# Patient Record
Sex: Female | Born: 1935 | Race: White | Hispanic: No | Smoking: Never smoker
Health system: Southern US, Community
[De-identification: ages and names within clinical notes are randomized; demographics above are authoritative.]

## PROBLEM LIST (undated history)

## (undated) DIAGNOSIS — E7800 Pure hypercholesterolemia, unspecified: Secondary | ICD-10-CM

## (undated) DIAGNOSIS — I10 Essential (primary) hypertension: Secondary | ICD-10-CM

## (undated) DIAGNOSIS — I2510 Atherosclerotic heart disease of native coronary artery without angina pectoris: Secondary | ICD-10-CM

## (undated) HISTORY — PX: HUMERUS SURGERY: SHX672

## (undated) HISTORY — PX: REPLACEMENT TOTAL KNEE: SUR1224

## (undated) HISTORY — PX: CARDIAC SURGERY: SHX584

## (undated) HISTORY — PX: BACK SURGERY: SHX140

---

## 2013-10-14 ENCOUNTER — Encounter (HOSPITAL_COMMUNITY): Payer: Self-pay | Admitting: Emergency Medicine

## 2013-10-14 ENCOUNTER — Emergency Department (HOSPITAL_COMMUNITY): Payer: Medicare Other

## 2013-10-14 ENCOUNTER — Emergency Department (HOSPITAL_COMMUNITY)
Admission: EM | Admit: 2013-10-14 | Discharge: 2013-10-14 | Disposition: A | Discharge status: Home / Self Care | Payer: Medicare Other | Attending: Emergency Medicine | Admitting: Emergency Medicine

## 2013-10-14 DIAGNOSIS — I10 Essential (primary) hypertension: Secondary | ICD-10-CM | POA: Insufficient documentation

## 2013-10-14 DIAGNOSIS — Z951 Presence of aortocoronary bypass graft: Secondary | ICD-10-CM | POA: Insufficient documentation

## 2013-10-14 DIAGNOSIS — Z79899 Other long term (current) drug therapy: Secondary | ICD-10-CM | POA: Insufficient documentation

## 2013-10-14 DIAGNOSIS — L97209 Non-pressure chronic ulcer of unspecified calf with unspecified severity: Secondary | ICD-10-CM

## 2013-10-14 DIAGNOSIS — Z87828 Personal history of other (healed) physical injury and trauma: Secondary | ICD-10-CM | POA: Insufficient documentation

## 2013-10-14 DIAGNOSIS — I8000 Phlebitis and thrombophlebitis of superficial vessels of unspecified lower extremity: Secondary | ICD-10-CM | POA: Insufficient documentation

## 2013-10-14 DIAGNOSIS — Z7901 Long term (current) use of anticoagulants: Secondary | ICD-10-CM | POA: Insufficient documentation

## 2013-10-14 DIAGNOSIS — I809 Phlebitis and thrombophlebitis of unspecified site: Secondary | ICD-10-CM

## 2013-10-14 DIAGNOSIS — I2510 Atherosclerotic heart disease of native coronary artery without angina pectoris: Secondary | ICD-10-CM | POA: Insufficient documentation

## 2013-10-14 DIAGNOSIS — E7800 Pure hypercholesterolemia, unspecified: Secondary | ICD-10-CM | POA: Insufficient documentation

## 2013-10-14 HISTORY — DX: Essential (primary) hypertension: I10

## 2013-10-14 HISTORY — DX: Pure hypercholesterolemia, unspecified: E78.00

## 2013-10-14 HISTORY — DX: Atherosclerotic heart disease of native coronary artery without angina pectoris: I25.10

## 2013-10-14 LAB — BASIC METABOLIC PANEL
BUN: 14 mg/dL (ref 6–23)
CHLORIDE: 95 meq/L — AB (ref 96–112)
CO2: 28 mEq/L (ref 19–32)
CREATININE: 0.8 mg/dL (ref 0.50–1.10)
Calcium: 9.6 mg/dL (ref 8.4–10.5)
GFR calc non Af Amer: 68 mL/min — ABNORMAL LOW (ref >90)
GFR, EST AFRICAN AMERICAN: 79 mL/min — AB (ref >90)
GLUCOSE: 105 mg/dL — AB (ref 70–99)
Potassium: 4.5 mEq/L (ref 3.7–5.3)
Sodium: 133 mEq/L — ABNORMAL LOW (ref 137–147)

## 2013-10-14 LAB — CBC WITH DIFFERENTIAL/PLATELET
Basophils Absolute: 0 K/uL (ref 0.0–0.1)
Basophils Relative: 0 % (ref 0–1)
Eosinophils Absolute: 0 K/uL (ref 0.0–0.7)
Eosinophils Relative: 1 % (ref 0–5)
HCT: 35.1 % — ABNORMAL LOW (ref 36.0–46.0)
Hemoglobin: 11.7 g/dL — ABNORMAL LOW (ref 12.0–15.0)
Lymphocytes Relative: 22 % (ref 12–46)
Lymphs Abs: 1.1 K/uL (ref 0.7–4.0)
MCH: 31.1 pg (ref 26.0–34.0)
MCHC: 33.3 g/dL (ref 30.0–36.0)
MCV: 93.4 fL (ref 78.0–100.0)
Monocytes Absolute: 0.9 K/uL (ref 0.1–1.0)
Monocytes Relative: 19 % — ABNORMAL HIGH (ref 3–12)
Neutro Abs: 2.8 K/uL (ref 1.7–7.7)
Neutrophils Relative %: 58 % (ref 43–77)
Platelets: 185 K/uL (ref 150–400)
RBC: 3.76 MIL/uL — ABNORMAL LOW (ref 3.87–5.11)
RDW: 15.9 % — ABNORMAL HIGH (ref 11.5–15.5)
WBC: 4.7 K/uL (ref 4.0–10.5)

## 2013-10-14 MED ORDER — DOXYCYCLINE HYCLATE 100 MG PO CAPS: 100.0000 mg | ORAL_CAPSULE | Freq: Two times a day (BID) | ORAL | Status: AC

## 2013-10-14 MED ORDER — ONDANSETRON HCL 4 MG/2ML IJ SOLN
4.0000 mg | Freq: Once | INTRAMUSCULAR | Status: AC
  Administered 2013-10-14: 4 mg via INTRAVENOUS
  Filled 2013-10-14: qty 2

## 2013-10-14 MED ORDER — MORPHINE SULFATE 2 MG/ML IJ SOLN
2.0000 mg | Freq: Once | INTRAMUSCULAR | Status: AC
  Administered 2013-10-14: 2 mg via INTRAVENOUS
  Filled 2013-10-14: qty 1

## 2013-10-14 MED ORDER — OXYCODONE-ACETAMINOPHEN 5-325 MG PO TABS: 1.0000 | ORAL_TABLET | ORAL | Status: AC | PRN

## 2013-10-14 NOTE — ED Provider Notes (Signed)
CSN: [id number]     Arrival date & time 10/14/13  1101 History   This chart was scribed for Gilda Creasehristopher J. Dougles Kimmey, * by Manuela Schwartzaylor Day, ED scribe. This patient was seen in room APA07/APA07 and the patient's care was started at 1101.  Chief Complaint  Patient presents with  . Cellulitis   The history is provided by the patient. No language interpreter was used.   HPI Comments: Heidi Fox is a 80 y.o. female who presents to the Emergency Department complaining of cellulitis to her left lower extremity, ongoing for x1 month. She states it began as a wound from scraping her leg against the side of her car while getting in her car. She has had home health care check on this problem for her. She has also been taking sulfa medicine for this problem. She states achiness seems to have increased from her wound over her left lateral lower leg to the surrounding area with calf pain. She denies SOB, CP or fever.   Past Medical History  Diagnosis Date  . Hypertension   . Coronary artery disease   . High cholesterol    Past Surgical History  Procedure Laterality Date  . Cardiac surgery      CABG 2012  . Replacement total knee    . Back surgery    . Humerus surgery     No family history on file. History  Substance Use Topics  . Smoking status: Never Smoker   . Smokeless tobacco: Not on file  . Alcohol Use: Yes   OB History   Grav Para Term Preterm Abortions TAB SAB Ect Mult Living                 Review of Systems  Constitutional: Negative for fever and chills.  Respiratory: Negative for cough and shortness of breath.   Cardiovascular: Negative for chest pain.  Gastrointestinal: Negative for nausea and vomiting.  Musculoskeletal: Negative for back pain.  Skin: Positive for wound. Negative for rash.  All other systems reviewed and are negative.    Allergies  Review of patient's allergies indicates no known allergies.  Home Medications   Current Outpatient Rx  Name  Route  Sig   Dispense  Refill  . atorvastatin (LIPITOR) 40 MG tablet   Oral   Take 40 mg by mouth at bedtime.         . calcium-vitamin D (CALCIUM 500/D) 500-200 MG-UNIT per tablet   Oral   Take 1 tablet by mouth daily with breakfast.         . cholecalciferol (VITAMIN D) 1000 UNITS tablet   Oral   Take 1,000 Units by mouth daily.         . ferrous sulfate 325 (65 FE) MG tablet   Oral   Take 325 mg by mouth daily with breakfast.         . furosemide (LASIX) 40 MG tablet   Oral   Take 40 mg by mouth daily.         . isosorbide mononitrate (IMDUR) 30 MG 24 hr tablet   Oral   Take 30 mg by mouth daily.         . Multiple Vitamin (MULTIVITAMIN WITH MINERALS) TABS tablet   Oral   Take 1 tablet by mouth daily.         . pantoprazole (PROTONIX) 40 MG tablet   Oral   Take 40 mg by mouth daily.         .Marland Kitchen  ramipril (ALTACE) 10 MG capsule   Oral   Take 10 mg by mouth 2 (two) times daily.         . sotalol (BETAPACE) 120 MG tablet   Oral   Take 120 mg by mouth 2 (two) times daily.          Marland Kitchen warfarin (COUMADIN) 6 MG tablet   Oral   Take 3-6 mg by mouth every evening. Takes 1/2 tablet (3 mg) on Mondays and Thursdays Takes 1 tablet (6 mg) all other days         . doxycycline (VIBRAMYCIN) 100 MG capsule   Oral   Take 1 capsule (100 mg total) by mouth 2 (two) times daily.   20 capsule   0   . oxyCODONE-acetaminophen (PERCOCET) 5-325 MG per tablet   Oral   Take 1 tablet by mouth every 4 (four) hours as needed.   30 tablet   0     Triage Vitals: BP 154/62  Pulse 68  Temp(Src) 98.1 F (36.7 C)  Resp 18  Ht 5\' 4"  (1.626 m)  Wt 165 lb (74.844 kg)  BMI 28.31 kg/m2  SpO2 98%  Physical Exam  Nursing note and vitals reviewed. Constitutional: She is oriented to person, place, and time. She appears well-developed and well-nourished. No distress.  HENT:  Head: Normocephalic and atraumatic.  Eyes: Conjunctivae are normal. Right eye exhibits no discharge. Left eye  exhibits no discharge.  Neck: Normal range of motion.  Cardiovascular: Normal rate, regular rhythm and normal heart sounds.   No murmur heard. Pulmonary/Chest: Effort normal. No respiratory distress.  Musculoskeletal: Normal range of motion. She exhibits no edema.  Neurological: She is alert and oriented to person, place, and time.  Skin: Skin is warm and dry. There is erythema.  4x5 cm ulcerated area of black eschar and granualtion tissue, left lateral lower leg.   Psychiatric: She has a normal mood and affect. Thought content normal.    ED Course  Procedures (including critical care time) DIAGNOSTIC STUDIES: Oxygen Saturation is 100% on room air, normal by my interpretation.    COORDINATION OF CARE: At 1145 AM Discussed treatment plan with patient which includes left tib/fib X-ray, blood work. Patient agrees.   Labs Review Labs Reviewed  CBC WITH DIFFERENTIAL - Abnormal; Notable for the following:    RBC 3.76 (*)    Hemoglobin 11.7 (*)    HCT 35.1 (*)    RDW 15.9 (*)    Monocytes Relative 19 (*)    All other components within normal limits  BASIC METABOLIC PANEL - Abnormal; Notable for the following:    Sodium 133 (*)    Chloride 95 (*)    Glucose, Bld 105 (*)    GFR calc non Af Amer 68 (*)    GFR calc Af Amer 79 (*)    All other components within normal limits   Imaging Review No results found.   EKG Interpretation None      MDM   Final diagnoses:  Superficial phlebitis  Ulcer of calf   Patient presents with chronic ulceration of the leg. There are no concerning signs for deep infection. Externally, there is good granulation tissue without any significant drainage. X-ray did not show any signs of osteomyelitis. Venous ultrasound did not show any evidence of DVT. Arrangements made for outpatient followup at wound center.  I personally performed the services described in this documentation, which was scribed in my presence. The recorded information has been  reviewed  and is accurate.      Gilda Crease, MD 10/19/13 [phone]

## 2013-10-14 NOTE — ED Notes (Signed)
Pt c/o cellulitis to lle. Pt told to come to ED by home health nurse.

## 2013-10-14 NOTE — ED Notes (Signed)
Pt received discharge instructions and prescriptions, verbalized understanding and has no further questions. Pt ambulated to exit in stable condition accompanied by daughter.  Advised to return to emergency department with new or worsening symptoms.  

## 2013-10-14 NOTE — Discharge Instructions (Signed)
Phlebitis Phlebitis is soreness and swelling (inflammation) of a vein. This can occur in your arms, legs, or torso (trunk), as well as deeper inside your body. Phlebitis is usually not serious when it occurs close to the surface of the body. However, it can cause serious problems when it occurs in a vein deeper inside the body. CAUSES  Phlebitis can be triggered by various things, including:   Reduced blood flow through your veins. This can happen with:  Bed rest over a long period.  Long-distance travel.  Injury.  Surgery.  Being overweight (obese) or pregnant.  Having an IV tube put in the vein and getting certain medicines through the vein.  Cancer and cancer treatment.  Use of illegal drugs taken through the vein.  Inflammatory diseases.  Inherited (genetic) diseases that increase the risk of blood clots.  Hormone therapy, such as birth control pills. SIGNS AND SYMPTOMS   Red, tender, swollen, and painful area on your skin. Usually, the area will be long and narrow.  Firmness along the center of the affected area. This can indicate that a blood clot has formed.  Low-grade fever. DIAGNOSIS  A health care provider can usually diagnose phlebitis by examining the affected area and asking about your symptoms. To check for infection or blood clots, your health care provider may order blood tests or an ultrasound exam of the area. Blood tests and your family history may also indicate if you have an underlying genetic disease that causes blood clots. Occasionally, a piece of tissue is taken from the body (biopsy sample) if an unusual cause of phlebitis is suspected. TREATMENT  Treatment will vary depending on the severity of the condition and the area of the body affected. Treatment may include:  Use of a warm compress or heating pad.  Use of compression stockings or bandages.  Anti-inflammatory medicines.  Removal of any IV tube that may be causing the problem.  Medicines  that kill germs (antibiotics) if an infection is present.  Blood-thinning medicines if a blood clot is suspected or present.  In rare cases, surgery may be needed to remove damaged sections of vein. HOME CARE INSTRUCTIONS   Only take over-the-counter or prescription medicines as directed by your health care provider. Take all medicines exactly as prescribed.  Raise (elevate) the affected area above the level of your heart as directed by your health care provider.  Apply a warm compress or heating pad to the affected area as directed by your health care provider. Do not sleep with the heating pad.  Use compression stockings or bandages as directed. These will speed healing and prevent the condition from coming back.  If you are on blood thinners:  Get follow-up blood tests as directed by your health care provider.  Check with your health care provider before using any new medicines.  Carry a medical alert card or wear your medical alert jewelry to show that you are on blood thinners.  For phlebitis in the legs:  Avoid prolonged standing or bed rest.  Keep your legs moving. Raise your legs when sitting or lying.  Do not smoke.  Women, particularly those over the age of 59, should consider the risks and benefits of taking the contraceptive pill. This kind of hormone treatment can increase your risk for blood clots.  Follow up with your health care provider as directed. SEEK MEDICAL CARE IF:   You have unusual bruising or any bleeding problems.  Your swelling or pain in the affected area  is not improving.  You are on anti-inflammatory medicine, and you develop belly (abdominal) pain. SEEK IMMEDIATE MEDICAL CARE IF:   You have a sudden onset of chest pain or difficulty breathing.  You have a fever or persistent symptoms for more than 2 3 days.  You have a fever and your symptoms suddenly get worse. MAKE SURE YOU:  Understand these instructions.  Will watch your  condition.  Will get help right away if you are not doing well or get worse. Document Released: 07/08/2001 Document Revised: 05/04/2013 Document Reviewed: 03/21/2013 [facility] Patient Information 2014 [place], [place].  Skin Ulcer A skin ulcer is an open sore that can be shallow or deep. Skin ulcers sometimes become infected and are difficult to treat. It may be 1 month or longer before real healing progress is made. CAUSES   Injury.  Problems with the veins or arteries.  Diabetes.  Insect bites.  Bedsores.  Inflammatory conditions. SYMPTOMS   Pain, redness, swelling, and tenderness around the ulcer.  Fever.  Bleeding from the ulcer.  Yellow or clear fluid coming from the ulcer. DIAGNOSIS  There are many types of skin ulcers. Any open sores will be examined. Certain tests will be done to determine the kind of ulcer you have. The right treatment depends on the type of ulcer you have. TREATMENT  Treatment is a long-term challenge. It may include:  Wearing an elastic wrap, compression stockings, or gel cast over the ulcer area.  Taking antibiotic medicines or putting antibiotic creams on the affected area if there is an infection. HOME CARE INSTRUCTIONS  Put on your bandages (dressings), wraps, or casts over the ulcer as directed by your caregiver.  Change all dressings as directed by your caregiver.  Take all medicines as directed by your caregiver.  Keep the affected area clean and dry.  Avoid injuries to the affected area.  Eat a well-balanced, healthy diet that includes plenty of fruit and vegetables.  If you smoke, consider quitting or decreasing the amount of cigarettes you smoke.  Once the ulcer heals, get regular exercise as directed by your caregiver.  Work with your caregiver to make sure your blood pressure, cholesterol, and diabetes are well-controlled.  Keep your skin moisturized. Dry skin can crack and lead to skin ulcers. SEEK IMMEDIATE MEDICAL  CARE IF:   Your pain gets worse.  You have swelling, redness, or fluids around the ulcer.  You have chills.  You have a fever. MAKE SURE YOU:   Understand these instructions.  Will watch your condition.  Will get help right away if you are not doing well or get worse. Document Released: 08/21/2004 Document Revised: 10/06/2011 Document Reviewed: 02/28/2011 [facility] Patient Information 2014 [employer], [place].

## 2013-10-17 ENCOUNTER — Ambulatory Visit (HOSPITAL_COMMUNITY)
Admission: RE | Admit: 2013-10-17 | Discharge: 2013-10-17 | Disposition: A | Discharge status: Home / Self Care | Payer: Medicare Other | Source: Ambulatory Visit | Attending: Emergency Medicine | Admitting: Emergency Medicine

## 2013-10-17 DIAGNOSIS — I10 Essential (primary) hypertension: Secondary | ICD-10-CM | POA: Insufficient documentation

## 2013-10-17 DIAGNOSIS — T148XXA Other injury of unspecified body region, initial encounter: Secondary | ICD-10-CM

## 2013-10-17 DIAGNOSIS — S91009A Unspecified open wound, unspecified ankle, initial encounter: Secondary | ICD-10-CM

## 2013-10-17 DIAGNOSIS — S81009A Unspecified open wound, unspecified knee, initial encounter: Secondary | ICD-10-CM | POA: Insufficient documentation

## 2013-10-17 DIAGNOSIS — S81809A Unspecified open wound, unspecified lower leg, initial encounter: Secondary | ICD-10-CM

## 2013-10-17 DIAGNOSIS — IMO0001 Reserved for inherently not codable concepts without codable children: Secondary | ICD-10-CM | POA: Insufficient documentation

## 2013-10-17 NOTE — Evaluation (Signed)
Physical Therapy Evaluation  Patient Details  Name: Heidi Fox MRN: [id number] Date of Birth: 12/21/1930  Today's Date: 10/17/2013 Time: [id number] PT Time Calculation (min): 45 min Charge:  evaluation             Visit#: 1 of 12  Re-eval: 11/16/13    Authorization: medicare    Authorization Visit#: 1 of 12   Past Medical History:  Past Medical History  Diagnosis Date  . Hypertension   . Coronary artery disease   . High cholesterol    Past Surgical History:  Past Surgical History  Procedure Laterality Date  . Cardiac surgery      CABG 2012  . Replacement total knee    . Back surgery    . Humerus surgery      Subjective Symptoms/Limitations Symptoms: Ms. Ellin GoodieDivine states that she fell on 09/07/2013 and fx her arm.  On 09/10/2013 her daughter was assisting her into the car when she slipped and hit her let on the kickboard of the car causing a skin tear.  The pt states her wound has gotten bigger and more painful ever since.  Due to swelling and pain she had a doppler study completed on 10/14/2013 which was (-) for blood clot but she was told that there was artery blockage but she was not told how much.  Ms Kulesza has spider veins and states she has had them for years  How long can you sit comfortably?: Pt states sitting and walking is much easier than raising her leg which is so painful that she has not slept well since this happened.   How long can you walk comfortably?: Pt is walking with a cane for short distances, (She did not want to walk 6850ft back to exam room requested a wheelchair. Pain Assessment Currently in Pain?: Yes Pain Score: 10-Worst pain ever Pain Location: Leg Pain Orientation: Left Balance Screening  yes; Pt activity has decreased  Objective:   Wound 1:   Location: anterior lateral aspect of Lt LE Length: 3.2 cm Width: 3.0 cm Depth: appears to be 0 Tunneling: 0 Granulation: 5% Slough: black eschar 95% Drainage (amount/description): min Periwound:  skin is slough  Wound 2: Location :       Inferior to wound one Length:          7.0 cm Width:           6.2 cm Depth :          Appears to be none Granulation:  60% Slough:         40% black eschar.  Dressing Used:  Dressing: medical honey, 4x4, and kerlix. Coban was applied lightly due to swelling but therapist noted almost immediate reddening of toes therefore it was taken off. Other:        Gentle retro massage to try and decrease swelling  Sharps Used/Location: forceps Tissue Remove: dead skin only.  Only tissue that was "hanging" was taken due to arterial nature of wound.     Clinical  Impressions: Pt is a 80  year old referred to OP PT for wound care with impairments listed below.  Pt will benefit from skilled OP PT wound care to address current impairments listed below: Increased necrotic tissue Decreased Granulation Tissue Altered Sensation Mixed Insufficiency Edema Decreased Mobility Decreased knowledge of wound care I  PT. Plan: Selective  Minor use of sharps debridement, Dressing Change,  LASER therapy, Pt/family Education  Frequency/Duration:  ___3___x/week for ____4_weeks.  PT Prognosis Wound Therapy -  Potential for Goals: Excellent Good Fair Poor  Goals: STG Wound Therapy Goals - Improve the function of patient's integumentary system by progressing the wound(s) through the phases of wound healing by:  Decrease Necrotic Tissue to: 0  Decrease Necrotic Tissue - Progress: Goal set today  Increase Granulation Tissue to: 100  Increase Granulation Tissue - Progress: Goal set today  Decrease Length/Width/Depth by (cm): Both wound decreased by 3.0 x 2.0 cm STG: Decrease Length/Width/Depth - Progress: Goal set today  Improve Drainage Characteristics: scant Improve Drainage Characteristics - Progress: Goal set today  Patient/Family will be able to : verbalize to try and keep LE lowered Patient/Family Instruction Goal - Progress: Goal set today  LTG Decrease Pt Pain  level down to 4/10 LTG First wound to be healed second wound to be no greater than 3x3cm  Goals/treatment plan/discharge plan were made with and agreed upon by patient/family: Yes    Problem List Patient Active Problem List   Diagnosis Date Noted  . Nonhealing nonsurgical wound 10/17/2013       GP Functional Assessment Tool Used: clinical judgement Functional Limitation: Other PT primary Other PT Primary Current Status (W0981): At least 80 percent but less than 100 percent impaired, limited or restricted Other PT Primary Goal Status (X9147): At least 20 percent but less than 40 percent impaired, limited or restricted  Monnie Gudgel,CINDY 10/17/2013, 3:05 PM  Physician Documentation Your signature is required to indicate approval of the treatment plan as stated above.  Please sign and either send electronically or make a copy of this report for your files and return this physician signed original.   Please mark one 1.__approve of plan  2. ___approve of plan with the following conditions.   ______________________________                                                          _____________________ Physician Signature                                                                                                             Date

## 2013-10-19 ENCOUNTER — Emergency Department (HOSPITAL_COMMUNITY)
Admission: EM | Admit: 2013-10-19 | Discharge: 2013-10-19 | Disposition: A | Discharge status: Home / Self Care | Payer: Medicare Other | Attending: Emergency Medicine | Admitting: Emergency Medicine

## 2013-10-19 ENCOUNTER — Encounter (HOSPITAL_COMMUNITY): Payer: Self-pay | Admitting: Emergency Medicine

## 2013-10-19 ENCOUNTER — Emergency Department (HOSPITAL_COMMUNITY): Payer: Medicare Other

## 2013-10-19 ENCOUNTER — Ambulatory Visit (HOSPITAL_COMMUNITY)
Admission: RE | Admit: 2013-10-19 | Discharge: 2013-10-19 | Disposition: A | Discharge status: Home / Self Care | Payer: Medicare Other | Source: Ambulatory Visit | Attending: Emergency Medicine | Admitting: Emergency Medicine

## 2013-10-19 DIAGNOSIS — M79606 Pain in leg, unspecified: Secondary | ICD-10-CM

## 2013-10-19 DIAGNOSIS — E7800 Pure hypercholesterolemia, unspecified: Secondary | ICD-10-CM | POA: Insufficient documentation

## 2013-10-19 DIAGNOSIS — Z79899 Other long term (current) drug therapy: Secondary | ICD-10-CM | POA: Insufficient documentation

## 2013-10-19 DIAGNOSIS — Z7901 Long term (current) use of anticoagulants: Secondary | ICD-10-CM | POA: Insufficient documentation

## 2013-10-19 DIAGNOSIS — I2510 Atherosclerotic heart disease of native coronary artery without angina pectoris: Secondary | ICD-10-CM | POA: Insufficient documentation

## 2013-10-19 DIAGNOSIS — Z9889 Other specified postprocedural states: Secondary | ICD-10-CM | POA: Insufficient documentation

## 2013-10-19 DIAGNOSIS — Z951 Presence of aortocoronary bypass graft: Secondary | ICD-10-CM | POA: Insufficient documentation

## 2013-10-19 DIAGNOSIS — F17200 Nicotine dependence, unspecified, uncomplicated: Secondary | ICD-10-CM | POA: Insufficient documentation

## 2013-10-19 DIAGNOSIS — M79609 Pain in unspecified limb: Secondary | ICD-10-CM | POA: Insufficient documentation

## 2013-10-19 DIAGNOSIS — I10 Essential (primary) hypertension: Secondary | ICD-10-CM | POA: Insufficient documentation

## 2013-10-19 LAB — COMPREHENSIVE METABOLIC PANEL WITH GFR
ALT: 12 U/L (ref 0–35)
AST: 22 U/L (ref 0–37)
Albumin: 3.2 g/dL — ABNORMAL LOW (ref 3.5–5.2)
Alkaline Phosphatase: 97 U/L (ref 39–117)
BUN: 13 mg/dL (ref 6–23)
CO2: 28 meq/L (ref 19–32)
Calcium: 9.4 mg/dL (ref 8.4–10.5)
Chloride: 98 meq/L (ref 96–112)
Creatinine, Ser: 0.55 mg/dL (ref 0.50–1.10)
GFR calc Af Amer: >90 mL/min
GFR calc non Af Amer: 86 mL/min — ABNORMAL LOW
Glucose, Bld: 113 mg/dL — ABNORMAL HIGH (ref 70–99)
Potassium: 4.1 meq/L (ref 3.7–5.3)
Sodium: 137 meq/L (ref 137–147)
Total Bilirubin: 0.5 mg/dL (ref 0.3–1.2)
Total Protein: 6.6 g/dL (ref 6.0–8.3)

## 2013-10-19 LAB — CBC WITH DIFFERENTIAL/PLATELET
Basophils Absolute: 0 10*3/uL (ref 0.0–0.1)
Basophils Relative: 0 % (ref 0–1)
Eosinophils Absolute: 0 10*3/uL (ref 0.0–0.7)
Eosinophils Relative: 1 % (ref 0–5)
HCT: 33.4 % — ABNORMAL LOW (ref 36.0–46.0)
HEMOGLOBIN: 10.9 g/dL — AB (ref 12.0–15.0)
LYMPHS ABS: 1 10*3/uL (ref 0.7–4.0)
LYMPHS PCT: 22 % (ref 12–46)
MCH: 30.9 pg (ref 26.0–34.0)
MCHC: 32.6 g/dL (ref 30.0–36.0)
MCV: 94.6 fL (ref 78.0–100.0)
MONOS PCT: 20 % — AB (ref 3–12)
Monocytes Absolute: 0.9 10*3/uL (ref 0.1–1.0)
NEUTROS PCT: 57 % (ref 43–77)
Neutro Abs: 2.5 10*3/uL (ref 1.7–7.7)
Platelets: 208 10*3/uL (ref 150–400)
RBC: 3.53 MIL/uL — ABNORMAL LOW (ref 3.87–5.11)
RDW: 16.1 % — ABNORMAL HIGH (ref 11.5–15.5)
WBC: 4.3 10*3/uL (ref 4.0–10.5)

## 2013-10-19 MED ORDER — SULFAMETHOXAZOLE-TMP DS 800-160 MG PO TABS: 1.0000 | ORAL_TABLET | Freq: Two times a day (BID) | ORAL | Status: AC

## 2013-10-19 MED ORDER — OXYCODONE-ACETAMINOPHEN 5-325 MG PO TABS: 1.0000 | ORAL_TABLET | Freq: Four times a day (QID) | ORAL | Status: AC | PRN

## 2013-10-19 MED ORDER — OXYCODONE-ACETAMINOPHEN 5-325 MG PO TABS
ORAL_TABLET | ORAL | Status: AC
  Filled 2013-10-19: qty 1

## 2013-10-19 MED ORDER — OXYCODONE-ACETAMINOPHEN 5-325 MG PO TABS
1.0000 | ORAL_TABLET | Freq: Once | ORAL | Status: AC
  Administered 2013-10-19: 1 via ORAL

## 2013-10-19 NOTE — ED Provider Notes (Signed)
CSN: [id number]     Arrival date & time 10/19/13  1048 History   First MD Initiated Contact with Patient 10/19/13 1115  This chart was scribed for Benny Lennert, MD by Valera Castle, ED Scribe. This patient was seen in room APA10/APA10 and the patient's care was started at 11:20 AM.    Chief Complaint  Patient presents with  . Leg Pain   (Consider location/radiation/quality/duration/timing/severity/associated sxs/prior Treatment) Patient is a 80 y.o. female presenting with leg pain. The history is provided by the patient. No language interpreter was used.  Leg Pain Location:  Leg Time since incident:  1 week Leg location:  L leg Pain details:    Duration:  1 week   Timing:  Constant   Progression:  Worsening Worsened by:  Bearing weight Associated symptoms: swelling (left foot)   Associated symptoms: no back pain and no fatigue   Associated symptoms comment:  Purple discoloration to left toes  HPI Comments: Heidi Fox is a 80 y.o. female who presents to the Emergency Department complaining of gradually worsening, constant, left LE pain, onset 1 week ago. She reports having doppler last week to rule out DVT. Results were negative. She reports associated increasing purple discoloration over her left toes and increasing swelling to her left foot. She states she was seen at wound care center and sent to ED. She reports not being able to ambulate due to the pain. She states prior to 1 week ago, she was able to walk around to relieve her LE pain. She denies any other symptoms.  PCP - Olen Cordial, MD  Past Medical History  Diagnosis Date  . Hypertension   . Coronary artery disease   . High cholesterol    Past Surgical History  Procedure Laterality Date  . Cardiac surgery      CABG 2012  . Replacement total knee    . Back surgery    . Humerus surgery     No family history on file. History  Substance Use Topics  . Smoking status: Never Smoker   . Smokeless tobacco: Not on  file  . Alcohol Use: Yes   OB History   Grav Para Term Preterm Abortions TAB SAB Ect Mult Living                 Review of Systems  Constitutional: Negative for appetite change and fatigue.  HENT: Negative for congestion, ear discharge and sinus pressure.   Eyes: Negative for discharge.  Respiratory: Negative for cough.   Cardiovascular: Positive for leg swelling (left ankle, foot). Negative for chest pain.  Gastrointestinal: Negative for abdominal pain and diarrhea.  Genitourinary: Negative for frequency and hematuria.  Musculoskeletal: Positive for myalgias (Left LE). Negative for back pain.  Skin: Positive for color change (purple discoloration to left toes) and wound (ulcers over left LE). Negative for rash.  Neurological: Negative for seizures and headaches.  Psychiatric/Behavioral: Negative for hallucinations.   Allergies  Review of patient's allergies indicates no known allergies.  Home Medications   Current Outpatient Rx  Name  Route  Sig  Dispense  Refill  . atorvastatin (LIPITOR) 40 MG tablet   Oral   Take 40 mg by mouth at bedtime.         . calcium-vitamin D (CALCIUM 500/D) 500-200 MG-UNIT per tablet   Oral   Take 1 tablet by mouth daily with breakfast.         . cholecalciferol (VITAMIN D) 1000 UNITS tablet   Oral  Take 1,000 Units by mouth daily.         Marland Kitchen doxycycline (VIBRAMYCIN) 100 MG capsule   Oral   Take 1 capsule (100 mg total) by mouth 2 (two) times daily.   20 capsule   0   . ferrous sulfate 325 (65 FE) MG tablet   Oral   Take 325 mg by mouth daily with breakfast.         . furosemide (LASIX) 40 MG tablet   Oral   Take 40 mg by mouth daily.         . isosorbide mononitrate (IMDUR) 30 MG 24 hr tablet   Oral   Take 30 mg by mouth daily.         . Multiple Vitamin (MULTIVITAMIN WITH MINERALS) TABS tablet   Oral   Take 1 tablet by mouth daily.         Marland Kitchen oxyCODONE-acetaminophen (PERCOCET) 5-325 MG per tablet   Oral   Take  1 tablet by mouth every 4 (four) hours as needed.   30 tablet   0   . pantoprazole (PROTONIX) 40 MG tablet   Oral   Take 40 mg by mouth daily.         . ramipril (ALTACE) 10 MG capsule   Oral   Take 10 mg by mouth 2 (two) times daily.         . sotalol (BETAPACE) 120 MG tablet   Oral   Take 120 mg by mouth 2 (two) times daily.          Marland Kitchen warfarin (COUMADIN) 6 MG tablet   Oral   Take 3-6 mg by mouth every evening. Takes 1/2 tablet (3 mg) on Mondays and Thursdays Takes 1 tablet (6 mg) all other days          BP 158/82  Pulse 73  Temp(Src) 97.8 F (36.6 C) (Oral)  Resp 17  Ht 5\' 4"  (1.626 m)  Wt 165 lb (74.844 kg)  BMI 28.31 kg/m2  SpO2 99%  Physical Exam  Constitutional: She is oriented to person, place, and time. She appears well-developed.  HENT:  Head: Normocephalic.  Eyes: Conjunctivae are normal.  Neck: No tracheal deviation present.  Cardiovascular:  No murmur heard. Musculoskeletal: Normal range of motion. She exhibits tenderness.  Posterior calf tenderness.  Neurological: She is oriented to person, place, and time.  Skin: Skin is warm.  Left LE 2 large healing ulcers lateral anterior.  Psychiatric: She has a normal mood and affect.    ED Course  Procedures (including critical care time)  DIAGNOSTIC STUDIES: Oxygen Saturation is 99% on room air, normal by my interpretation.    COORDINATION OF CARE: 11:25 AM-Discussed treatment plan which includes US venous left LE with pt at bedside and pt agreed to plan.   Results for orders placed during the hospital encounter of 10/19/13  CBC WITH DIFFERENTIAL      Result Value Ref Range   WBC 4.3  4.0 - 10.5 K/uL   RBC 3.53 (*) 3.87 - 5.11 MIL/uL   Hemoglobin 10.9 (*) 12.0 - 15.0 g/dL   HCT 08.6 (*) 57.8 - 46.9 %   MCV 94.6  78.0 - 100.0 fL   MCH 30.9  26.0 - 34.0 pg   MCHC 32.6  30.0 - 36.0 g/dL   RDW 62.9 (*) 52.8 - 41.3 %   Platelets 208  150 - 400 K/uL   Neutrophils Relative % 57  43 - 77 %  Neutro Abs 2.5  1.7 - 7.7 K/uL   Lymphocytes Relative 22  12 - 46 %   Lymphs Abs 1.0  0.7 - 4.0 K/uL   Monocytes Relative 20 (*) 3 - 12 %   Monocytes Absolute 0.9  0.1 - 1.0 K/uL   Eosinophils Relative 1  0 - 5 %   Eosinophils Absolute 0.0  0.0 - 0.7 K/uL   Basophils Relative 0  0 - 1 %   Basophils Absolute 0.0  0.0 - 0.1 K/uL  COMPREHENSIVE METABOLIC PANEL      Result Value Ref Range   Sodium 137  137 - 147 mEq/L   Potassium 4.1  3.7 - 5.3 mEq/L   Chloride 98  96 - 112 mEq/L   CO2 28  19 - 32 mEq/L   Glucose, Bld 113 (*) 70 - 99 mg/dL   BUN 13  6 - 23 mg/dL   Creatinine, Ser 1.610.55  0.50 - 1.10 mg/dL   Calcium 9.4  8.4 - 09.610.5 mg/dL   Total Protein 6.6  6.0 - 8.3 g/dL   Albumin 3.2 (*) 3.5 - 5.2 g/dL   AST 22  0 - 37 U/L   ALT 12  0 - 35 U/L   Alkaline Phosphatase 97  39 - 117 U/L   Total Bilirubin 0.5  0.3 - 1.2 mg/dL   GFR calc non Af Amer 86 (*) >90 mL/min   GFR calc Af Amer >90  >90 mL/min   [place] Venous Img Lower Unilateral Left  10/19/2013   CLINICAL DATA:  Left leg pain.  Rule out DVT.  EXAM: LEFT LOWER EXTREMITY VENOUS DOPPLER ULTRASOUND  TECHNIQUE: Gray-scale sonography with graded compression, as well as color Doppler and duplex ultrasound, were performed to evaluate the deep venous system from the level of the common femoral vein through the popliteal and proximal calf veins. Spectral Doppler was utilized to evaluate flow at rest and with distal augmentation maneuvers.  COMPARISON:  None.  FINDINGS: Thrombus within deep veins:  None visualized.  Normal compressibility, augmentation and color Doppler flow in the left common femoral vein, left femoral vein and left popliteal vein. The visualized deep calf veins are patent.  Other findings: There is echogenic thrombus in the left great saphenous vein just distal to the saphenofemoral junction. The great saphenous vein is not well visualized in the thigh. There is partial compressibility of the great saphenous vein in the mid calf.  Partial compressibility of the great saphenous vein at the knee.  IMPRESSION: Negative for left lower extremity DVT.  Again noted is thrombus in the left great saphenous vein. The age of this superficial thrombus is unknown but the thrombus in the proximal thigh appears to be old or chronic.   Electronically Signed   By: Richarda OverlieAdam  Henn M.D.   On: 10/19/2013 13:55   Dg Tibia/fibula Left  10/19/2013   CLINICAL DATA:  Superficial leg injury.  Nonhealing  EXAM: LEFT TIBIA AND FIBULA - 2 VIEW  COMPARISON:  None  FINDINGS: aa subtle cutaneous irregularity on the anterior margin lower extremity. No subcutaneous gas. No radiodense foreign body. No osseous abnormality.  IMPRESSION: No osseous abnormality.  Mild skin deformity.   Electronically Signed   By: Genevive BiStewart  Edmunds M.D.   On: 10/19/2013 15:55     EKG Interpretation None     Medications  oxyCODONE-acetaminophen (PERCOCET/ROXICET) 5-325 MG per tablet 1 tablet (1 tablet Oral Given 10/19/13 1147)  oxyCODONE-acetaminophen (PERCOCET/ROXICET) 5-325 MG per tablet 1 tablet (1  tablet Oral Given 10/19/13 1435)   MDM   Final diagnoses:  None   The chart was scribed for me under my direct supervision.  I personally performed the history, physical, and medical decision making and all procedures in the evaluation of this patient.Benny Lennert, MD 10/20/13 [phone]

## 2013-10-19 NOTE — ED Notes (Signed)
Left foot red and swollen, pedal pulse palpable.  Dressing applied to left lower leg by PT prior to arrival to ed.

## 2013-10-19 NOTE — Discharge Instructions (Signed)
Follow up with your md next week.  Keep the leg elevated

## 2013-10-19 NOTE — ED Notes (Signed)
Pt reports had doppler last week that was negative for DVT but since then pt has had increased pain in leg, per home health nurse, toes are purple in dependent position, ankle and foot have increased edema, and pain with weight bearing.

## 2013-10-19 NOTE — Progress Notes (Signed)
  Patient Details  Name: Heidi Fox MRN: [id number] Date of Birth: 05/20/1931  Today's Date: 10/19/2013  Pt came to clinic for wound care to LLE wounds. Pt presents with note from home therapist who is extremely concerned about possible DVT. Pt had negative doppler test last Friday(10/14/13) but pain has significantly increased since this past Monday(10/17/13). Dressing was removed. Left foot is cool. Pt presents with significant swelling in LLE and positive Homan's sign. After consulting pt and daughter the decision was made to transfer pt to ED for evaluation for possible DVT. (No charge)  Heidi Fox, PTA  10/19/2013, 11:01 AM

## 2013-10-21 ENCOUNTER — Ambulatory Visit (HOSPITAL_COMMUNITY)
Admission: RE | Admit: 2013-10-21 | Discharge: 2013-10-21 | Disposition: A | Discharge status: Home / Self Care | Payer: Medicare Other | Source: Ambulatory Visit | Attending: Family Medicine | Admitting: Family Medicine

## 2013-10-21 NOTE — Progress Notes (Signed)
Physical Therapy - Wound Therapy  Treatment   Patient Details  Name: Heidi Fox MRN: [id number] Date of Birth: June 18, 1931  Today's Date: 10/21/2013 Time: 4098-1191 Time Calculation (min): 45 min Charges: manual therapy , Debridement  Visit#: 2 of 12  Re-eval: 11/16/13  Subjective Subjective Assessment Subjective: Ptient continues to have LE pain on L LE with wounds being mildly painful, but patient notes extreme tender to touch and pain along posterior calf secondary to blistering. Patient is unable to fully bear weight on L LE due to pain.  Patient and Family Stated Goals: To be able to walk with less pain throughotu weight bearing.  Date of Onset: 09/07/13  Pain Assessment Pain Assessment Pain Assessment: 0-10 Pain Score: 10-Worst pain ever Pain Type: Acute pain Pain Location: Leg Pain Orientation: Left Pain Intervention(s): Medication (See eMAR);MD notified (Comment)  Wound Therapy Debridement: sharps debridement of dead tissue and blisters.  Manual therapy: STM to increase blood flow and remove swelling.   Objective:  Wound 1:  Location: anterior lateral aspect of Lt LE  Length: 3.2 cm  Width: 3.0 cm  Depth: appears to be 0  Tunneling: 0  Granulation: 5%  Slough: black eschar 95%  Drainage (amount/description): min  Periwound: skin is slough  Wound 2:  Location : Inferior to wound one  Length: 7.0 cm  Width: 6.2 cm  Depth : Appears to be none  Granulation: 60%  Slough: 40% black eschar.  Dressing Used:  Dressing: medical honey, 4x4, and kerlix. Coban was applied lightly due to swelling but therapist noted almost immediate reddening of toes therefore it was taken off.  Other: Gentle retro massage to try and decrease swelling  Sharps Used/Location: forceps  Tissue Remove: dead skin only. Only tissue that was "hanging" was taken due to arterial nature of wound.   Physical Therapy Assessment and Plan Wound Therapy - Assess/Plan/Recommendations Wound  Therapy - Clinical Statement: Patient has a wound on her anterior tibia following  Factors Delaying/Impairing Wound Healing: Vascular compromise Hydrotherapy Plan: Debridement;Patient/family education Wound Therapy - Current Recommendations: PT       Clinical Impressions: Pt is a 80 year old referred to OP PT for wound care with impairments listed below. Pt will benefit from skilled OP PT wound care to address current impairments listed below:  Increased necrotic tissue  Decreased Granulation Tissue  Altered Sensation  Mixed Insufficiency  Edema  Decreased Mobility  Decreased knowledge of wound care  I  PT. Plan: Selective Minor use of sharps debridement, Dressing Change, LASER therapy, Pt/family Education  Frequency/Duration: ___3___x/week for ____4_weeks.  PT Prognosis  Wound Therapy - Potential for Goals: Excellent Good Fair Poor   Goals Goals: STG  Wound Therapy Goals - Improve the function of patient's integumentary system by progressing the wound(s) through the phases of wound healing by:  Decrease Necrotic Tissue to: 0  Decrease Necrotic Tissue - Progress: Goal set today  Increase Granulation Tissue to: 100  Increase Granulation Tissue - Progress: Goal set today  Decrease Length/Width/Depth by (cm): Both wound decreased by 3.0 x 2.0 cm STG:  Decrease Length/Width/Depth - Progress: Goal set today  Improve Drainage Characteristics: scant  Improve Drainage Characteristics - Progress: Goal set today  Patient/Family will be able to : verbalize to try and keep LE lowered  Patient/Family Instruction Goal - Progress: Goal set today  LTG  Decrease Pt Pain level down to 4/10 LTG  First wound to be healed second wound to be no greater than 3x3cm  Goals/treatment plan/discharge plan  were made with and agreed upon by patient/family: Yes   Problem List Difficulty weight bearing secondary to swelling following Lt Anterior shin wound  Problem List Patient Active Problem List    Diagnosis Date Noted  . Nonhealing nonsurgical wound 10/17/2013   GP   Aleyza Salmi R PT DPT 10/21/2013, 4:46 PM

## 2013-10-24 ENCOUNTER — Ambulatory Visit (HOSPITAL_COMMUNITY)
Admission: RE | Admit: 2013-10-24 | Discharge: 2013-10-24 | Disposition: A | Discharge status: Home / Self Care | Payer: Medicare Other | Source: Ambulatory Visit | Attending: Family Medicine | Admitting: Family Medicine

## 2013-10-24 NOTE — Progress Notes (Signed)
Physical Therapy Treatment Wound Care Patient Details  Name: Heidi Fox MRN: [id number] Date of Birth: 08/19/1930  Today's Date: 10/24/2013 Time: 4098-11910845-0930 PT Time Calculation (min): 45 min Charges: Selective debridement (= or < 20 cm)   Visit#: 3 of 12  Re-eval: 11/16/13  Authorization: medicare  Authorization Visit#: 3 of 12   Subjective: Symptoms/Limitations Symptoms: Pt states that continues to be limited by constant 10/10 pain. Pain Assessment Currently in Pain?: Yes Pain Score: 10-Worst pain ever Pain Location: Leg Pain Orientation: Right  Wound 1: Location: anterior lateral aspect of Lt LE  Length: 3.2 cm (Measured 10/17/13) Width: 3.0 cm (Measured 10/17/13) Depth: appears to be 0 Tunneling: 0  Granulation: 65%  Slough: black eschar 35%  Drainage (amount/description): min  Periwound: dry Sharps Used/Location: forceps  Tissue Remove: dead skin only, eschar and slough  Dressing: medical honey, 4x4, and kerlix.  Wound 2:  Location : Inferior to wound one  Length: 7.0 cm (Measured 10/17/13) Width: 6.2 cm (Measured 10/17/13) Depth: Appears to be none  Granulation: 55%  Slough: 5% Eschar: 40% Drainage (amount/description): min  Periwound: dry Sharps Used/Location: forceps  Tissue Remove: dead skin only, eschar and slough  Dressing: medical honey, 4x4, and kerlix.  Physical Therapy Assessment and Plan PT Assessment and Plan Clinical Impression Statement: Pt continues to be limited by 10/10 pain. Pt displays improved tolerance for debridement. Able to remove significant amount of black eschar. Xeroform applied to posterior leg where blisters were opened to protect skin integrity and decrease sensitivity/pain. Continued to medihoney to decrease necrotic tissue. Encourage pt to leave dressing on as long as possible. Advised her to only change wrapping if necessary.  PT Plan: Continue wound care per PT POC.     Problem List Patient Active Problem List   Diagnosis Date Noted  . Nonhealing nonsurgical wound 10/17/2013    PT - End of Session Activity Tolerance: Patient tolerated treatment well General Behavior During Therapy: [facility] for tasks assessed/performed  Seth Bakeebekah Juanelle Trueheart, PTA  10/24/2013, 10:47 AM

## 2013-10-26 ENCOUNTER — Ambulatory Visit (HOSPITAL_COMMUNITY)
Admission: RE | Admit: 2013-10-26 | Discharge: 2013-10-26 | Disposition: A | Discharge status: Home / Self Care | Payer: Medicare Other | Source: Ambulatory Visit | Attending: Family Medicine | Admitting: Family Medicine

## 2013-10-26 DIAGNOSIS — X58XXXA Exposure to other specified factors, initial encounter: Secondary | ICD-10-CM | POA: Insufficient documentation

## 2013-10-26 DIAGNOSIS — IMO0001 Reserved for inherently not codable concepts without codable children: Secondary | ICD-10-CM | POA: Diagnosis not present

## 2013-10-26 DIAGNOSIS — S81009A Unspecified open wound, unspecified knee, initial encounter: Secondary | ICD-10-CM | POA: Insufficient documentation

## 2013-10-26 DIAGNOSIS — I10 Essential (primary) hypertension: Secondary | ICD-10-CM | POA: Insufficient documentation

## 2013-10-26 DIAGNOSIS — S81809A Unspecified open wound, unspecified lower leg, initial encounter: Secondary | ICD-10-CM

## 2013-10-26 DIAGNOSIS — S91009A Unspecified open wound, unspecified ankle, initial encounter: Secondary | ICD-10-CM

## 2013-10-26 NOTE — Progress Notes (Signed)
Physical Therapy Treatment Patient Details  Name: Gordy Clementmanda Comunale MRN: [id number] Date of Birth: 12/11/1930  Today's Date: 10/26/2013 Time: [id number] PT Time Calculation (min): 40 min Charges: Selective debridement (= or < 20 cm)   Visit#: 4 of 12  Re-eval: 11/16/13  Authorization: medicare  Authorization Visit#: 4 of 12   Subjective: Symptoms/Limitations Symptoms: Pt had to change dressing because it was removed by orthopedist yesterday. Pain Assessment Currently in Pain?: Yes Pain Score: 10-Worst pain ever Pain Location: Leg Pain Orientation: Right   Wound 1:  Location: anterior lateral aspect of Lt LE  Length: 3.2 cm (Measured 10/17/13)  Width: 3.0 cm (Measured 10/17/13)  Depth: appears to be 0  Tunneling: 0  Granulation: 65%  Slough: black eschar 35%  Drainage (amount/description): min  Periwound: dry  Sharps Used/Location: forceps  Tissue Remove: dead skin only, eschar and slough  Dressing: medical honey, 4x4, and kerlix.  Wound 2:  Location : Inferior to wound one  Length: 7.0 cm (Measured 10/17/13)  Width: 6.2 cm (Measured 10/17/13)  Depth: Appears to be none  Granulation: 55%  Slough: 5%  Eschar: 40%  Drainage (amount/description): min  Periwound: dry  Sharps Used/Location: forceps  Tissue Remove: dead skin only, eschar and slough  Dressing: medical honey, 4x4, and kerlix.   Physical Therapy Assessment and Plan PT Assessment and Plan Clinical Impression Statement: Pt displays decreased tolerance for debridement as she is out of pain medicine. Advised pt to schedule appointment with PCP to have medicine refilled. Able to removed minimal amount of necrotic tissue secondary to sensitivity. Areas of thick black eschar were scored with scalpel. Continued with medihoney dressing to soften eschar. Xeroform applied once again to open blisters on posterior leg. PT Plan: Continue wound care per PT POC.     Problem List Patient Active Problem List   Diagnosis Date  Noted  . Nonhealing nonsurgical wound 10/17/2013    PT - End of Session Activity Tolerance: Patient tolerated treatment well General Behavior During Therapy: [facility] for tasks assessed/performed  Seth Bakeebekah Ellsie Violette, PTA  10/26/2013, 11:01 AM

## 2013-10-28 ENCOUNTER — Ambulatory Visit (HOSPITAL_COMMUNITY): Payer: Medicare Other | Admitting: Physical Therapy

## 2013-10-31 ENCOUNTER — Telehealth (HOSPITAL_COMMUNITY): Payer: Self-pay | Admitting: *Deleted

## 2013-10-31 ENCOUNTER — Inpatient Hospital Stay (HOSPITAL_COMMUNITY): Admission: RE | Admit: 2013-10-31 | Payer: Medicare Other | Source: Ambulatory Visit | Admitting: *Deleted

## 2013-11-01 ENCOUNTER — Telehealth (HOSPITAL_COMMUNITY): Payer: Self-pay

## 2013-11-02 ENCOUNTER — Ambulatory Visit (HOSPITAL_COMMUNITY): Payer: Medicare Other | Admitting: Physical Therapy

## 2013-11-04 ENCOUNTER — Ambulatory Visit (HOSPITAL_COMMUNITY): Payer: Medicare Other | Admitting: Physical Therapy

## 2015-11-10 IMAGING — CR DG TIBIA/FIBULA 2V*L*
2 series · 2 of 2 positions shown · non-contrast
Comparison: None.

CLINICAL DATA: Cellulitis

EXAM:
LEFT TIBIA AND FIBULA - 2 VIEW

[view not recorded (1 of 2)]
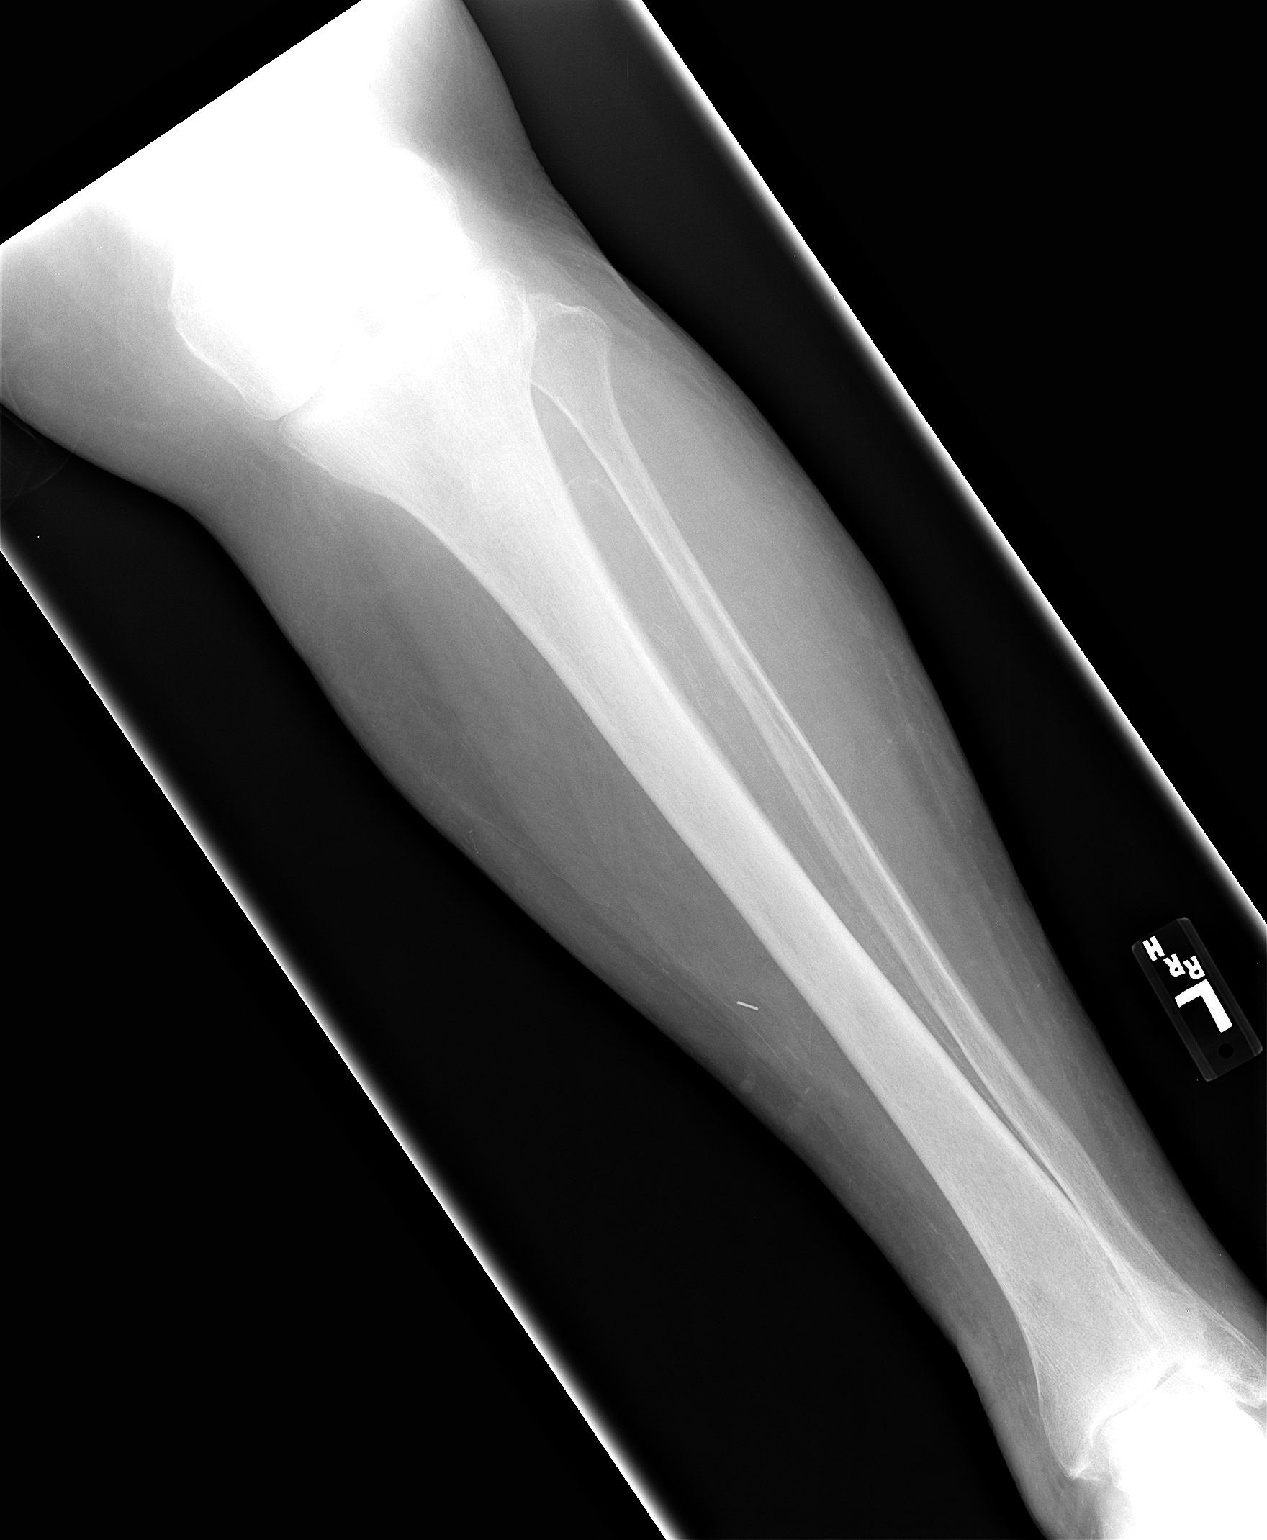

[view not recorded (2 of 2)]
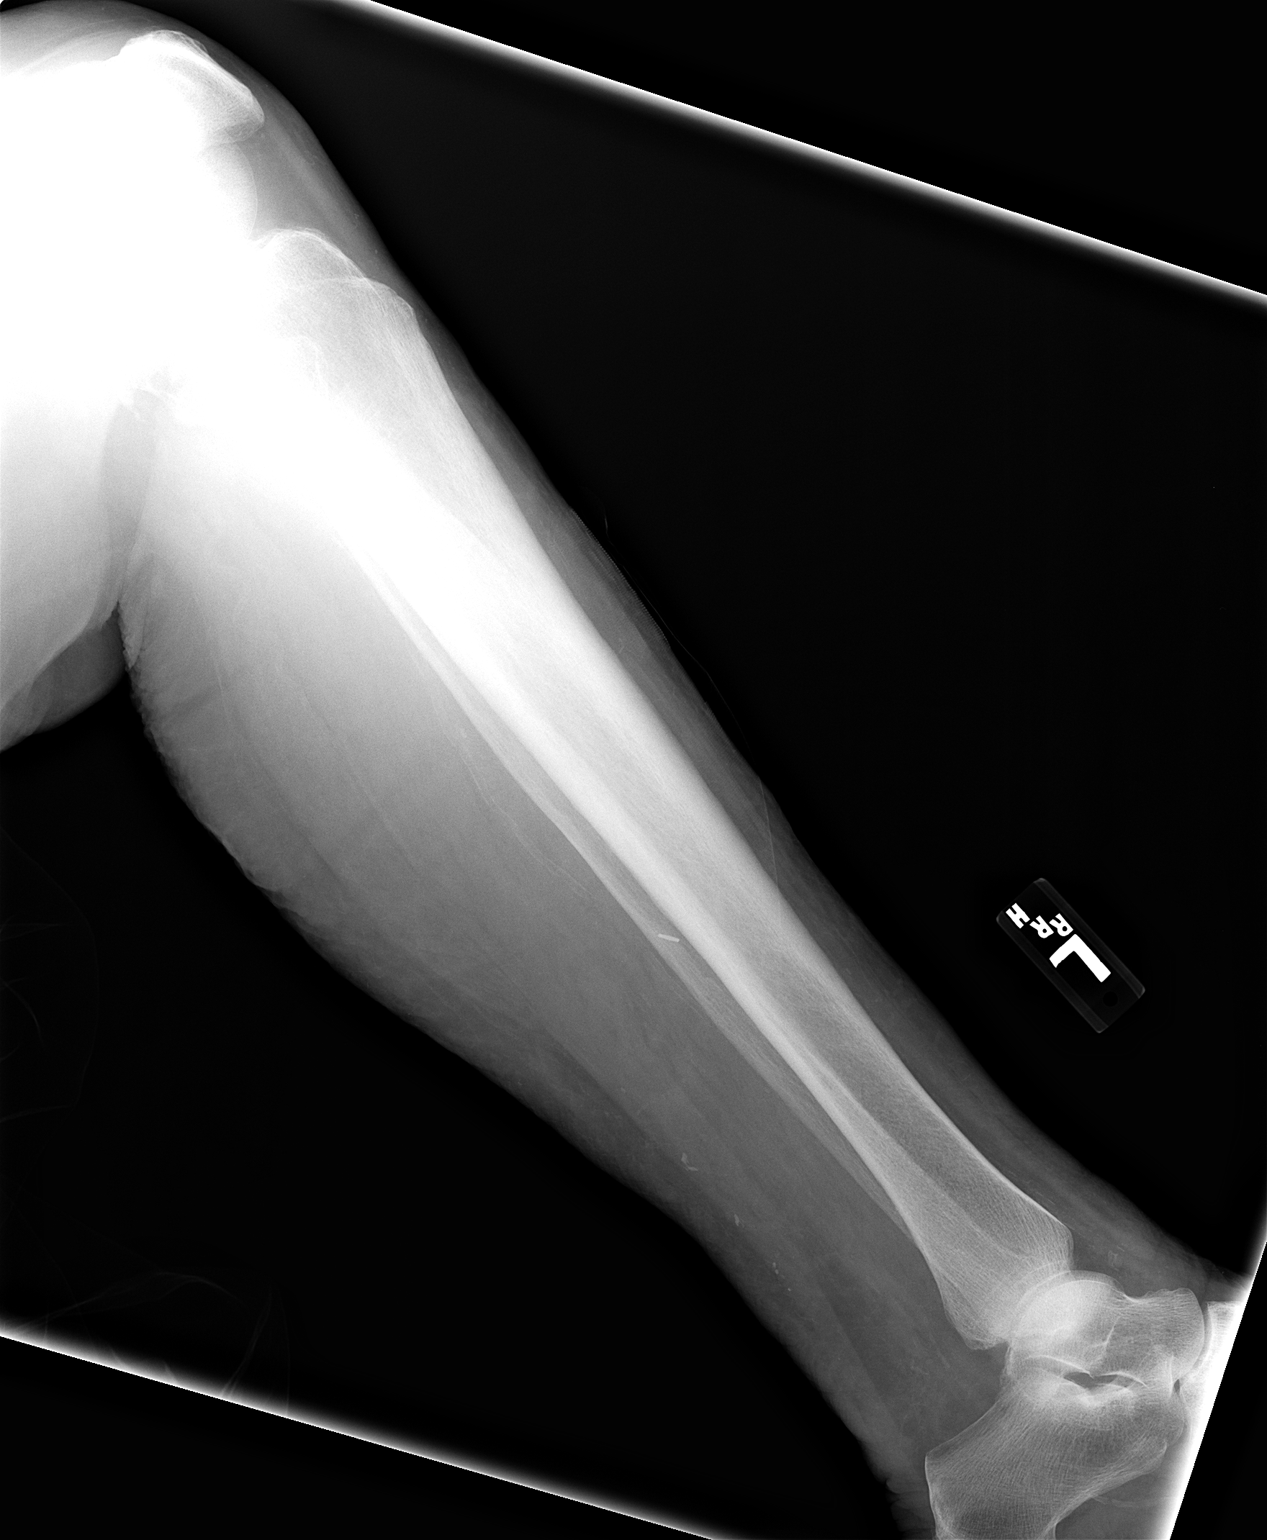

[2 of 2 positions shown; findings below may reference images not displayed]

FINDINGS: Frontal and lateral views were obtained. No fracture or dislocation.
No erosive change or bony destruction. No soft tissue air. There is
a surgical clip medial to the mid tibia. There are multiple foci of
arterial vascular calcification.
IMPRESSION: No erosive change or bony destruction. No soft tissue air. No
fracture or dislocation. There are multiple foci of arterial
vascular calcification.

## 2015-11-15 IMAGING — US US EXTREM LOW VENOUS*L*
1 series · 13 of 24 positions shown · non-contrast
Comparison: None.

CLINICAL DATA: Left leg pain.  Rule out DVT.

EXAM:
LEFT LOWER EXTREMITY VENOUS DOPPLER ULTRASOUND
TECHNIQUE: Gray-scale sonography with graded compression, as well as color
Doppler and duplex ultrasound, were performed to evaluate the deep
venous system from the level of the common femoral vein through the
popliteal and proximal calf veins. Spectral Doppler was utilized to
evaluate flow at rest and with distal augmentation maneuvers.

[Series 1: us extrem low venous*left* · 0.07mm/px · 13 of 59 slices shown]
[im 1/59]
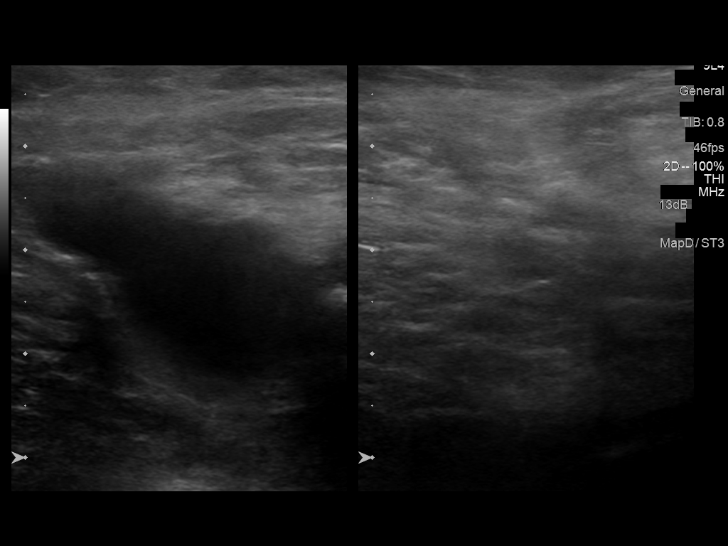
[im 6/59]
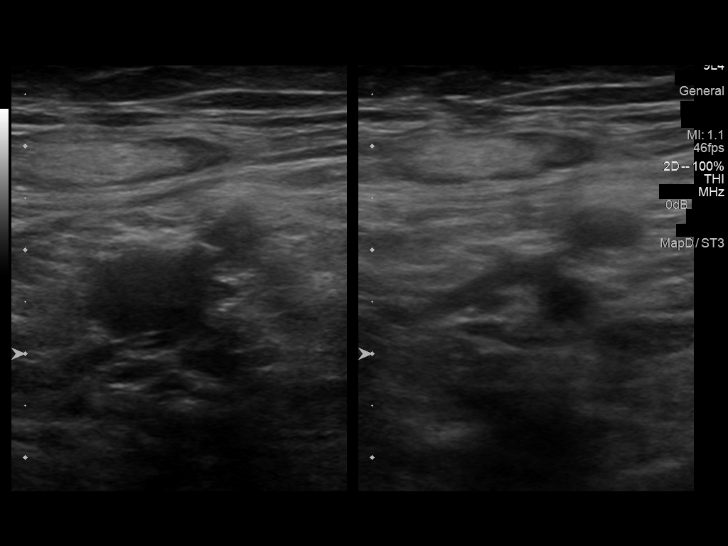
[im 11/59]
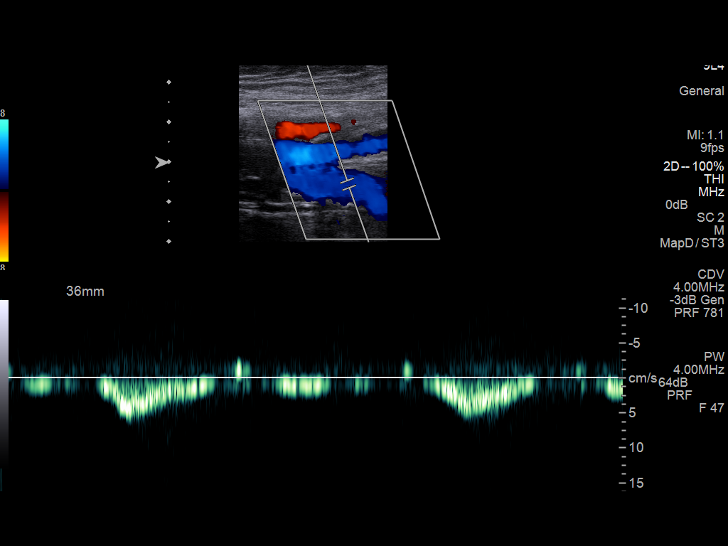
[im 16/59]
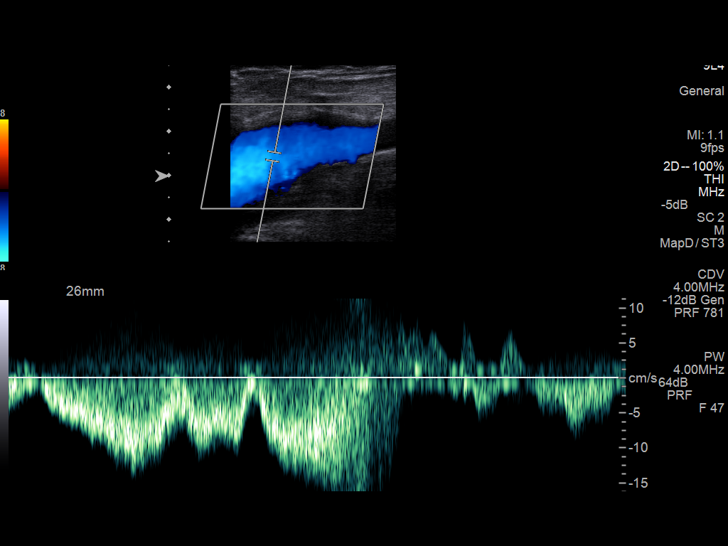
[im 21/59]
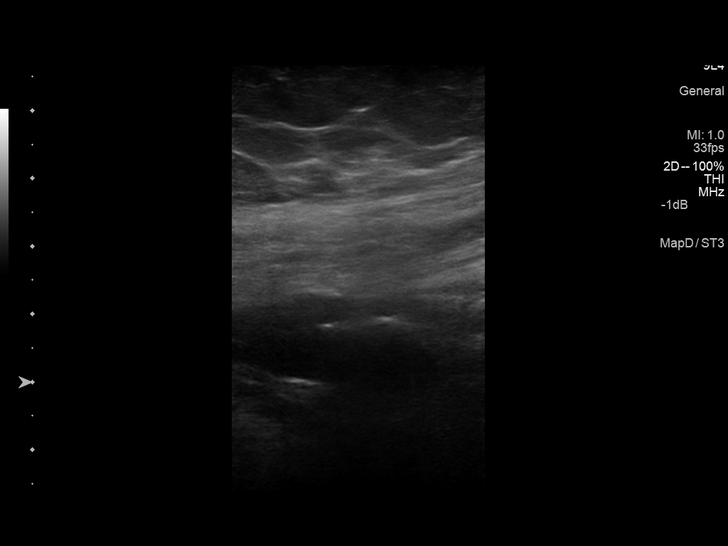
[im 26/59]
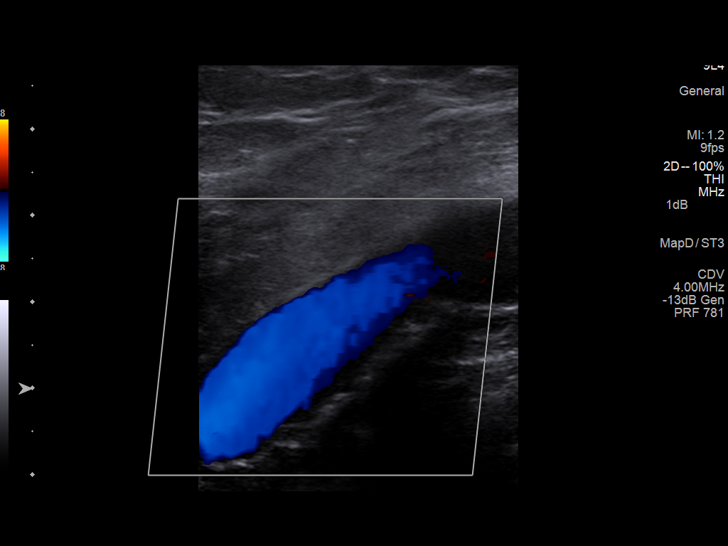
[im 31/59]
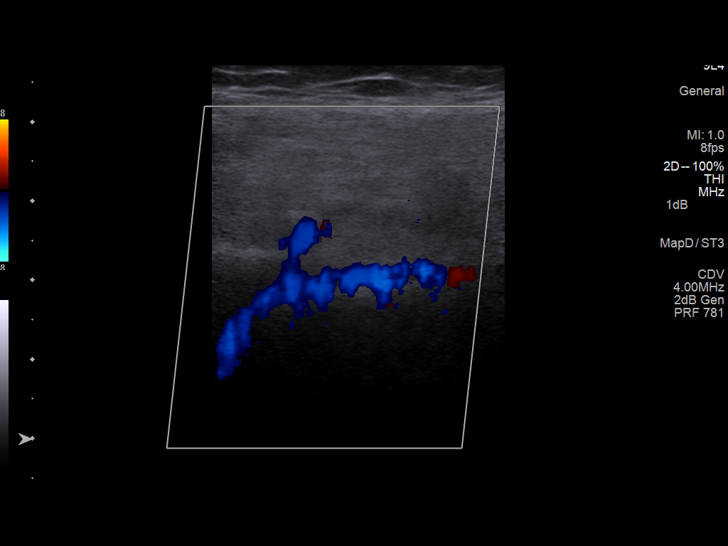
[im 33/59]
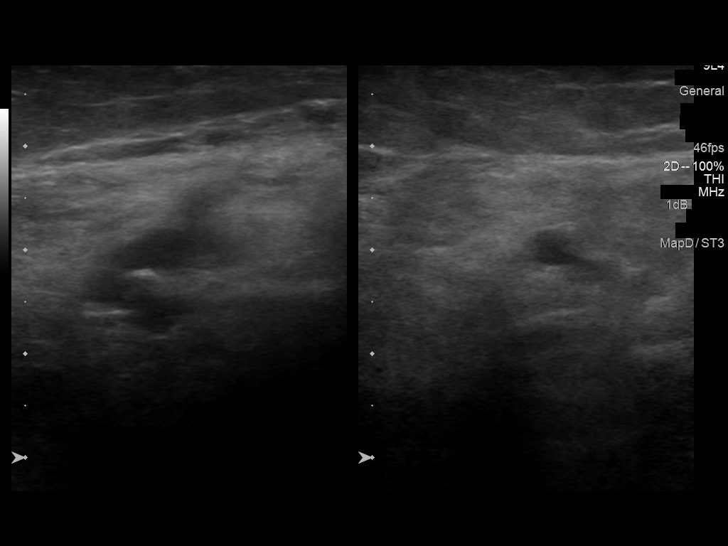
[im 38/59]
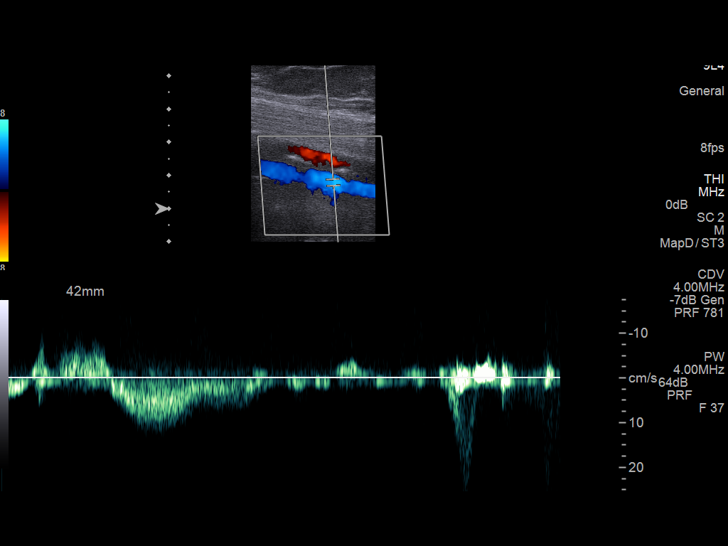
[im 43/59]
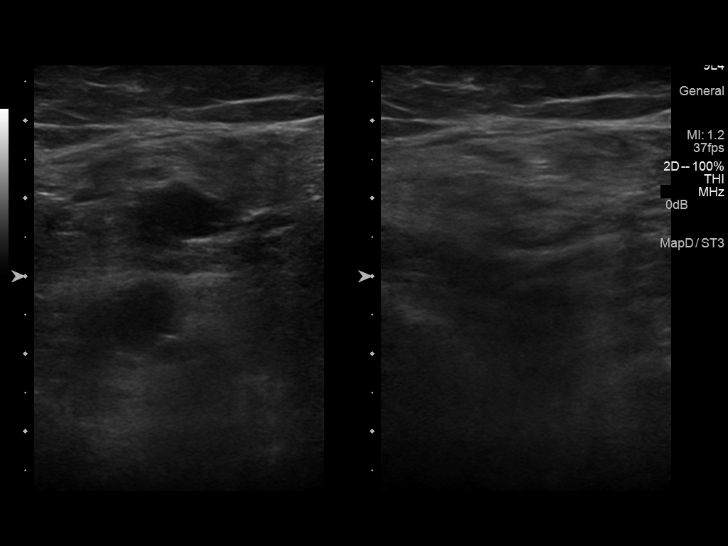
[im 48/59]
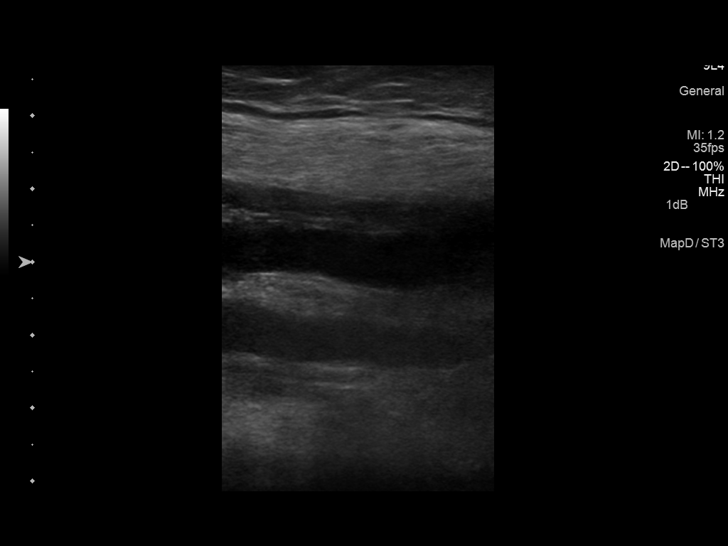
[im 53/59]
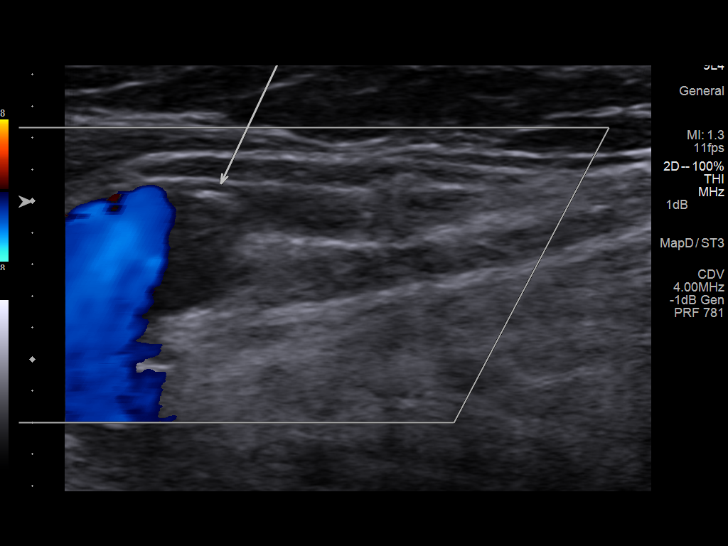
[im 59/59]
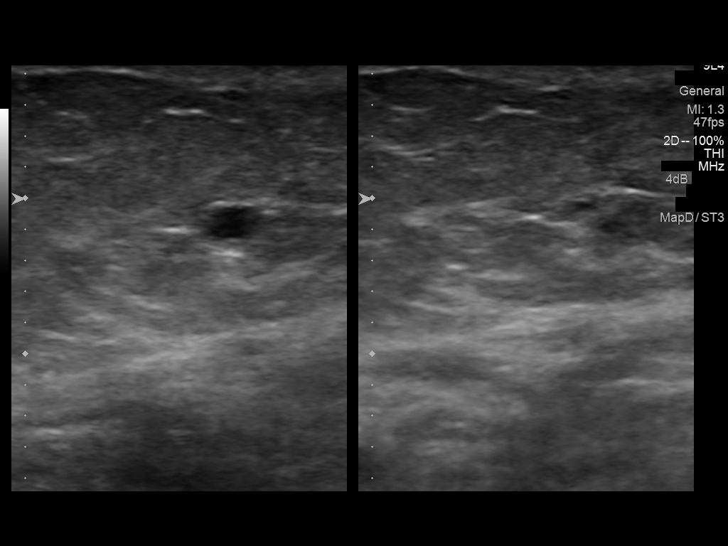

[13 of 24 positions shown; findings below may reference images not displayed]

FINDINGS: Thrombus within deep veins:  None visualized.

Normal compressibility, augmentation and color Doppler flow in the
left common femoral vein, left femoral vein and left popliteal vein.
The visualized deep calf veins are patent.

Other findings: There is echogenic thrombus in the left great
saphenous vein just distal to the saphenofemoral junction. The great
saphenous vein is not well visualized in the thigh. There is partial
compressibility of the great saphenous vein in the mid calf. Partial
compressibility of the great saphenous vein at the knee.
IMPRESSION: Negative for left lower extremity DVT.

Again noted is thrombus in the left great saphenous vein. The age of
this superficial thrombus is unknown but the thrombus in the
proximal thigh appears to be old or chronic.

## 2016-06-27 DEATH — deceased
